# Patient Record
Sex: Female | Born: 2000 | Race: White | Hispanic: No | Marital: Single | Smoking: Never smoker
Health system: Southern US, Community
[De-identification: ages and names within clinical notes are randomized; demographics above are authoritative.]

## PROBLEM LIST (undated history)

## (undated) DIAGNOSIS — Z889 Allergy status to unspecified drugs, medicaments and biological substances status: Secondary | ICD-10-CM

---
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
